# Patient Record
Sex: Male | Born: 1965 | Marital: Married
Health system: Southern US, Community
[De-identification: ages and names within clinical notes are randomized; demographics above are authoritative.]

---

## 2013-06-20 ENCOUNTER — Ambulatory Visit
Admission: RE | Admit: 2013-06-20 | Discharge: 2013-06-20 | Disposition: A | Discharge status: Home / Self Care | Payer: No Typology Code available for payment source | Source: Ambulatory Visit | Attending: Infectious Disease | Admitting: Infectious Disease

## 2013-06-20 ENCOUNTER — Other Ambulatory Visit: Payer: Self-pay | Admitting: Infectious Disease

## 2013-06-20 DIAGNOSIS — R7611 Nonspecific reaction to tuberculin skin test without active tuberculosis: Secondary | ICD-10-CM

## 2015-02-08 IMAGING — CR DG CHEST 1V
1 series · 1 of 1 positions shown · non-contrast
Comparison: None.

CLINICAL DATA: Positive PPD.  No chest complaints.

EXAM:
CHEST - 1 VIEW

[view not recorded]
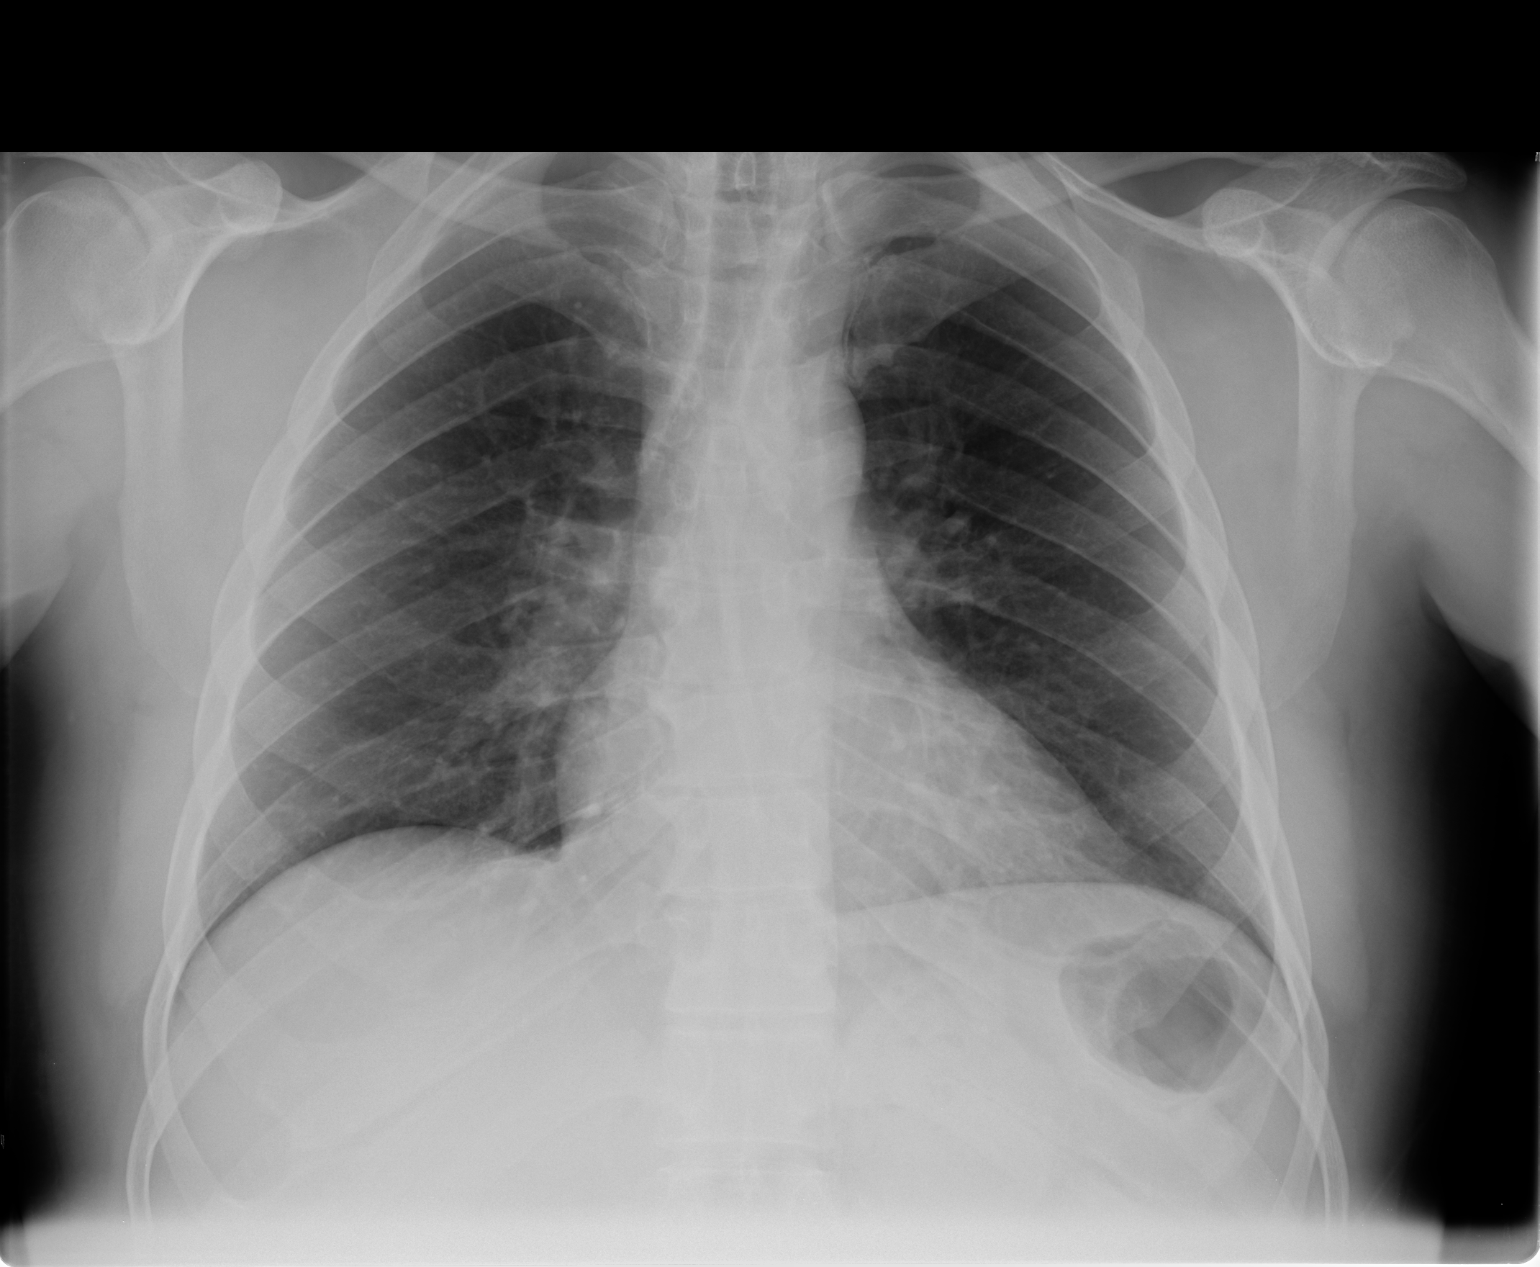

[1 of 1 positions shown; findings below may reference images not displayed]

FINDINGS: The heart size and mediastinal contours are normal. The lungs are
clear. There is no pleural effusion or pneumothorax. No acute
osseous findings are identified.
IMPRESSION: No active cardiopulmonary process. No radiographic evidence of
tuberculosis.

## 2019-11-29 ENCOUNTER — Ambulatory Visit: Payer: Self-pay | Attending: Internal Medicine

## 2019-11-29 DIAGNOSIS — Z23 Encounter for immunization: Secondary | ICD-10-CM | POA: Insufficient documentation

## 2019-11-29 NOTE — Progress Notes (Signed)
   Covid-[facility] Vaccination [facility]  Name:  Jerome Moore    MRN: [id number] DOB: 06-13-1966  11/29/2019  Jerome Moore was observed post Covid-19 immunization for 15 minutes without incidence. He was provided with Vaccine Information Sheet and instruction to access the V-Safe system.   Jerome Moore was instructed to call 911 with any severe reactions post vaccine: Marland Kitchen Difficulty breathing  . Swelling of your face and throat  . A fast heartbeat  . A bad rash all over your body  . Dizziness and weakness    Immunizations Administered    Name Date Dose VIS Date Route   Pfizer COVID-19 Vaccine 11/29/2019 12:22 PM 0.3 mL 09/12/2019 Intramuscular   Manufacturer: [employer], [employer]   Lot: [id number]   NDC: [address]-1

## 2019-12-20 ENCOUNTER — Ambulatory Visit: Payer: Self-pay | Attending: Internal Medicine

## 2019-12-20 DIAGNOSIS — Z23 Encounter for immunization: Secondary | ICD-10-CM

## 2019-12-20 NOTE — Progress Notes (Signed)
   Covid-19 Vaccination Clinic  Name:  Lark Runk    MRN: [id number] DOB: [birth year]  12/20/2019  Mr. Defenbaugh was observed post Covid-19 immunization for 15 minutes without incident. He was provided with Vaccine Information Sheet and instruction to access the V-Safe system.   Mr. Laura was instructed to call 911 with any severe reactions post vaccine: Marland Kitchen Difficulty breathing  . Swelling of face and throat  . A fast heartbeat  . A bad rash all over body  . Dizziness and weakness   Immunizations Administered    Name Date Dose VIS Date Route   Pfizer COVID-19 Vaccine 12/20/2019 11:51 AM 0.3 mL 09/12/2019 Intramuscular   Manufacturer: [employer], [employer]   Lot: [id number]   NDC: [address]-5

## 2019-12-24 ENCOUNTER — Ambulatory Visit: Payer: Self-pay
